# Patient Record
Sex: Male | Born: 1990 | Race: Black or African American | Hispanic: No | Marital: Single | Smoking: Current every day smoker
Health system: Southern US, Community
[De-identification: ages and names within clinical notes are randomized; demographics above are authoritative.]

## PROBLEM LIST (undated history)

## (undated) DIAGNOSIS — R011 Cardiac murmur, unspecified: Secondary | ICD-10-CM

## (undated) DIAGNOSIS — W3400XA Accidental discharge from unspecified firearms or gun, initial encounter: Secondary | ICD-10-CM

## (undated) HISTORY — PX: LEG SURGERY: SHX1003

---
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
## 2011-03-02 LAB — CBC
HCT: 34.7
RBC: 5.06
RDW: 21.1 — ABNORMAL HIGH
WBC: 6.2

## 2011-03-02 LAB — DIFFERENTIAL
Eosinophils Absolute: 0.1
Lymphocytes Relative: 56
Lymphs Abs: 3.4
Monocytes Relative: 9
Neutrophils Relative %: 32 — ABNORMAL LOW

## 2011-03-02 LAB — BASIC METABOLIC PANEL
Chloride: 107
Sodium: 139

## 2011-03-05 LAB — DIFFERENTIAL
Basophils Absolute: 0.1
Lymphs Abs: 4.3
Monocytes Absolute: 0.5

## 2011-03-05 LAB — CBC
HCT: 35.3
RDW: 18 — ABNORMAL HIGH
WBC: 7.1

## 2011-03-05 LAB — URINALYSIS, ROUTINE W REFLEX MICROSCOPIC
Hgb urine dipstick: NEGATIVE
Nitrite: NEGATIVE
Protein, ur: NEGATIVE
Specific Gravity, Urine: 1.025
Urobilinogen, UA: 1
pH: 7

## 2011-03-05 LAB — URINE CULTURE: Colony Count: NO GROWTH

## 2011-03-05 LAB — CULTURE, BLOOD (ROUTINE X 2)

## 2011-03-17 LAB — CBC
MCHC: 32.1
MCV: 64.6 — ABNORMAL LOW
Platelets: 534 — ABNORMAL HIGH
RDW: 17 — ABNORMAL HIGH

## 2011-03-17 LAB — DIFFERENTIAL
Basophils Relative: 1
Eosinophils Absolute: 0.3
Eosinophils Relative: 3
Lymphocytes Relative: 48
Lymphs Abs: 4
Monocytes Absolute: 0.6
Neutro Abs: 3.5
Neutrophils Relative %: 41

## 2011-03-17 LAB — I-STAT 8, (EC8 V) (CONVERTED LAB)
BUN: 5 — ABNORMAL LOW
Bicarbonate: 24.2 — ABNORMAL HIGH
HCT: 33
TCO2: 25

## 2011-03-17 LAB — POCT I-STAT CREATININE: Creatinine, Ser: 0.6

## 2015-12-09 ENCOUNTER — Emergency Department (HOSPITAL_COMMUNITY)
Admission: EM | Admit: 2015-12-09 | Discharge: 2015-12-09 | Disposition: A | Discharge status: Home / Self Care | Payer: Self-pay | Attending: Emergency Medicine | Admitting: Emergency Medicine

## 2015-12-09 ENCOUNTER — Encounter (HOSPITAL_COMMUNITY): Payer: Self-pay | Admitting: Emergency Medicine

## 2015-12-09 ENCOUNTER — Emergency Department (HOSPITAL_COMMUNITY): Payer: Self-pay

## 2015-12-09 DIAGNOSIS — F17200 Nicotine dependence, unspecified, uncomplicated: Secondary | ICD-10-CM | POA: Insufficient documentation

## 2015-12-09 DIAGNOSIS — M79671 Pain in right foot: Secondary | ICD-10-CM | POA: Insufficient documentation

## 2015-12-09 HISTORY — DX: Accidental discharge from unspecified firearms or gun, initial encounter: W34.00XA

## 2015-12-09 HISTORY — DX: Cardiac murmur, unspecified: R01.1

## 2015-12-09 MED ORDER — NAPROXEN 500 MG PO TABS: 500.0000 mg | ORAL_TABLET | Freq: Two times a day (BID) | ORAL | Status: AC
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
## 2015-12-09 NOTE — ED Notes (Signed)
Pt. reports worsening right foot pain onset this week , denies injury or fall / ambulatory .

## 2015-12-09 NOTE — Discharge Instructions (Signed)
Your x-ray today was normal. Take the prescribed medication as directed. [facility]-- may call to make appt. Return to the ED for new or worsening symptoms.

## 2016-08-06 ENCOUNTER — Encounter (HOSPITAL_COMMUNITY): Payer: Self-pay | Admitting: *Deleted

## 2016-08-06 ENCOUNTER — Emergency Department (HOSPITAL_COMMUNITY)
Admission: EM | Admit: 2016-08-06 | Discharge: 2016-08-06 | Disposition: A | Discharge status: Home / Self Care | Payer: Self-pay | Attending: Emergency Medicine | Admitting: Emergency Medicine

## 2016-08-06 DIAGNOSIS — F17200 Nicotine dependence, unspecified, uncomplicated: Secondary | ICD-10-CM | POA: Insufficient documentation

## 2016-08-06 DIAGNOSIS — L84 Corns and callosities: Secondary | ICD-10-CM | POA: Insufficient documentation

## 2016-08-06 DIAGNOSIS — Z79899 Other long term (current) drug therapy: Secondary | ICD-10-CM | POA: Insufficient documentation

## 2016-08-06 MED ORDER — HYDROCODONE-ACETAMINOPHEN 5-325 MG PO TABS: ORAL_TABLET | ORAL | 0 refills | Status: AC

## 2016-08-06 MED ORDER — HYDROCODONE-ACETAMINOPHEN 5-325 MG PO TABS
1.0000 | ORAL_TABLET | Freq: Once | ORAL | Status: AC
  Administered 2016-08-06: 1 via ORAL
  Filled 2016-08-06: qty 1

## 2016-08-06 MED ORDER — SULFAMETHOXAZOLE-TRIMETHOPRIM 800-160 MG PO TABS: 2.0000 | ORAL_TABLET | Freq: Two times a day (BID) | ORAL | 0 refills | Status: AC

## 2016-08-06 MED ORDER — SULFAMETHOXAZOLE-TRIMETHOPRIM 800-160 MG PO TABS
1.0000 | ORAL_TABLET | Freq: Once | ORAL | Status: AC
  Administered 2016-08-06: 1 via ORAL
  Filled 2016-08-06: qty 1

## 2016-08-06 NOTE — ED Notes (Signed)
PA at bedside.

## 2016-08-06 NOTE — ED Notes (Signed)
Ortho paged. 
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
## 2016-08-06 NOTE — ED Triage Notes (Signed)
Pt c/o R toe pain for several days, reports having a calloused area to toe that is painful, making it difficult to walk. Denies injury to toe

## 2016-08-06 NOTE — Progress Notes (Signed)
Orthopedic Tech Progress Note Patient Details:  Jeremiah Moon September 06, 1990 [id number]  Ortho Devices Type of Ortho Device: Crutches Ortho Device/Splint Interventions: Ordered, Application   Trinna PostMartinez, [place] Delauder J 08/06/2016, 5:32 AM

## 2016-08-06 NOTE — Discharge Instructions (Signed)
Take vicodin for breakthrough pain, do not drink alcohol, drive, care for children or do other critical tasks while taking vicodin. ° ° °

## 2016-08-06 NOTE — ED Notes (Signed)
Ortho at bedside.

## 2017-03-14 ENCOUNTER — Emergency Department (HOSPITAL_COMMUNITY): Payer: Self-pay

## 2017-03-14 ENCOUNTER — Encounter (HOSPITAL_COMMUNITY): Payer: Self-pay | Admitting: Emergency Medicine

## 2017-03-14 ENCOUNTER — Emergency Department (HOSPITAL_COMMUNITY)
Admission: EM | Admit: 2017-03-14 | Discharge: 2017-03-15 | Disposition: A | Discharge status: Home / Self Care | Payer: Self-pay | Attending: Physician Assistant | Admitting: Physician Assistant

## 2017-03-14 DIAGNOSIS — F17200 Nicotine dependence, unspecified, uncomplicated: Secondary | ICD-10-CM | POA: Insufficient documentation

## 2017-03-14 DIAGNOSIS — W3400XD Accidental discharge from unspecified firearms or gun, subsequent encounter: Secondary | ICD-10-CM

## 2017-03-14 DIAGNOSIS — Z79899 Other long term (current) drug therapy: Secondary | ICD-10-CM | POA: Insufficient documentation

## 2017-03-14 DIAGNOSIS — M79604 Pain in right leg: Secondary | ICD-10-CM | POA: Insufficient documentation
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
## 2017-03-15 NOTE — ED Notes (Signed)
This RN came out of room #29 and was on the way to the desk when the pt's girlfriend was wheeling him out in a wheelchair. This RN asked the patient if he wanted his discharge papers and he replied "Hell no I don't want that shit!  Ya'll didn't do you fucking job"! This RN is not sure what had occurred other then the fact he was up for discharge. Pt left the facility without further care and his discharge paperwork.
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
## 2017-03-15 NOTE — Discharge Instructions (Signed)
Please take Ibuprofen (Advil, motrin) and Tylenol (acetaminophen) to relieve your pain.  You may take up to 600 MG (3 pills) of normal strength ibuprofen every 8 hours as needed.    Do not drink alcohol while taking these medications.  Do not take other NSAID'S while taking ibuprofen (such as aleve or naproxen).  Please take ibuprofen with food to decrease stomach upset.  Please follow-up with the orthopedist (bone doctor) close to you.  [facility] should have given you follow up information when you were discharged.    Please make sure you are keeping your leg elevated above your heart.  Failure to do so will result in increased swelling and discomfort.

## 2018-11-30 ENCOUNTER — Other Ambulatory Visit: Payer: Self-pay

## 2018-11-30 ENCOUNTER — Encounter (HOSPITAL_COMMUNITY): Payer: Self-pay | Admitting: Emergency Medicine

## 2018-11-30 ENCOUNTER — Emergency Department (HOSPITAL_COMMUNITY): Payer: Medicaid Other

## 2018-11-30 ENCOUNTER — Emergency Department (HOSPITAL_COMMUNITY)
Admission: EM | Admit: 2018-11-30 | Discharge: 2018-11-30 | Disposition: A | Discharge status: Home / Self Care | Payer: Medicaid Other | Attending: Emergency Medicine | Admitting: Emergency Medicine

## 2018-11-30 DIAGNOSIS — L03031 Cellulitis of right toe: Secondary | ICD-10-CM | POA: Insufficient documentation

## 2018-11-30 DIAGNOSIS — F17210 Nicotine dependence, cigarettes, uncomplicated: Secondary | ICD-10-CM | POA: Insufficient documentation

## 2018-11-30 DIAGNOSIS — Z8614 Personal history of Methicillin resistant Staphylococcus aureus infection: Secondary | ICD-10-CM | POA: Insufficient documentation

## 2018-11-30 MED ORDER — OXYCODONE-ACETAMINOPHEN 5-325 MG PO TABS
1.0000 | ORAL_TABLET | Freq: Once | ORAL | Status: AC
  Administered 2018-11-30: 1 via ORAL
  Filled 2018-11-30: qty 1

## 2018-11-30 MED ORDER — CLINDAMYCIN HCL 150 MG PO CAPS: 450.0000 mg | ORAL_CAPSULE | Freq: Three times a day (TID) | ORAL | 0 refills | Status: AC

## 2018-11-30 MED ORDER — CLINDAMYCIN HCL 150 MG PO CAPS
450.0000 mg | ORAL_CAPSULE | Freq: Once | ORAL | Status: AC
  Administered 2018-11-30: 450 mg via ORAL
  Filled 2018-11-30: qty 3

## 2018-11-30 NOTE — Discharge Instructions (Signed)
Take clindamycin as prescribed for treatment of presumed cellulitis to your toe.  We recommend that this be taken with an over-the-counter probiotic to prevent diarrhea which is a known side effect of antibiotic use.  A probiotic can be purchased at your local pharmacy.  Take ibuprofen or Aleve for management of pain.  We recommend that you have your wound rechecked in 48 hours.  I would be happy to recheck your wound in 2 days and will be working at [facility] emergency department after 10 PM on 12/02/2018.  Return to an ED sooner if symptoms worsen.
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
## 2018-11-30 NOTE — ED Triage Notes (Signed)
Pt c/o right foot and toe pain after his nephew stepped on his foot x 3 days ago.

## 2018-11-30 NOTE — ED Notes (Signed)
Patient verbalized understanding of dc instructions, vss, ambulatory with nad.   
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
## 2023-04-11 DIAGNOSIS — R001 Bradycardia, unspecified: Secondary | ICD-10-CM | POA: Diagnosis not present

## 2023-04-12 DIAGNOSIS — R7989 Other specified abnormal findings of blood chemistry: Secondary | ICD-10-CM | POA: Diagnosis not present

## 2023-04-12 DIAGNOSIS — R55 Syncope and collapse: Secondary | ICD-10-CM | POA: Diagnosis not present

## 2023-04-12 DIAGNOSIS — R9431 Abnormal electrocardiogram [ECG] [EKG]: Secondary | ICD-10-CM | POA: Diagnosis not present

## 2023-09-27 DIAGNOSIS — I456 Pre-excitation syndrome: Secondary | ICD-10-CM | POA: Diagnosis not present

## 2023-09-27 DIAGNOSIS — R079 Chest pain, unspecified: Secondary | ICD-10-CM | POA: Diagnosis not present

## 2023-09-27 DIAGNOSIS — R001 Bradycardia, unspecified: Secondary | ICD-10-CM
